# Patient Record
Sex: Female | Born: 1960 | Race: White | Hispanic: No | Marital: Married | Smoking: Former smoker
Health system: Southern US, Community
[De-identification: ages and names within clinical notes are randomized; demographics above are authoritative.]

## PROBLEM LIST (undated history)

## (undated) DIAGNOSIS — F32A Depression, unspecified: Secondary | ICD-10-CM

## (undated) DIAGNOSIS — F419 Anxiety disorder, unspecified: Secondary | ICD-10-CM

## (undated) DIAGNOSIS — G5600 Carpal tunnel syndrome, unspecified upper limb: Secondary | ICD-10-CM

## (undated) DIAGNOSIS — M545 Low back pain, unspecified: Secondary | ICD-10-CM

## (undated) DIAGNOSIS — M1990 Unspecified osteoarthritis, unspecified site: Secondary | ICD-10-CM

## (undated) DIAGNOSIS — R51 Headache: Secondary | ICD-10-CM

## (undated) DIAGNOSIS — N83202 Unspecified ovarian cyst, left side: Secondary | ICD-10-CM

## (undated) DIAGNOSIS — R519 Headache, unspecified: Secondary | ICD-10-CM

## (undated) DIAGNOSIS — M79603 Pain in arm, unspecified: Secondary | ICD-10-CM

## (undated) DIAGNOSIS — E559 Vitamin D deficiency, unspecified: Secondary | ICD-10-CM

## (undated) DIAGNOSIS — L719 Rosacea, unspecified: Secondary | ICD-10-CM

## (undated) HISTORY — DX: Headache: R51

## (undated) HISTORY — DX: Rosacea, unspecified: L71.9

## (undated) HISTORY — DX: Unspecified ovarian cyst, left side: N83.202

## (undated) HISTORY — DX: Low back pain: M54.5

## (undated) HISTORY — DX: Low back pain, unspecified: M54.50

## (undated) HISTORY — DX: Carpal tunnel syndrome, unspecified upper limb: G56.00

## (undated) HISTORY — DX: Vitamin D deficiency, unspecified: E55.9

## (undated) HISTORY — DX: Headache, unspecified: R51.9

## (undated) HISTORY — DX: Pain in arm, unspecified: M79.603

---

## 1998-12-01 ENCOUNTER — Other Ambulatory Visit: Admission: RE | Admit: 1998-12-01 | Discharge: 1998-12-01 | Payer: Self-pay | Admitting: *Deleted

## 2002-07-26 ENCOUNTER — Other Ambulatory Visit: Admission: RE | Admit: 2002-07-26 | Discharge: 2002-07-26 | Payer: Self-pay | Admitting: Family Medicine

## 2002-08-17 ENCOUNTER — Encounter: Payer: Self-pay | Admitting: Family Medicine

## 2002-08-17 ENCOUNTER — Ambulatory Visit (HOSPITAL_COMMUNITY): Admission: RE | Admit: 2002-08-17 | Discharge: 2002-08-17 | Payer: Self-pay | Admitting: Family Medicine

## 2003-02-06 ENCOUNTER — Other Ambulatory Visit: Admission: RE | Admit: 2003-02-06 | Discharge: 2003-02-06 | Payer: Self-pay | Admitting: Family Medicine

## 2004-11-13 ENCOUNTER — Ambulatory Visit (HOSPITAL_COMMUNITY): Admission: RE | Admit: 2004-11-13 | Discharge: 2004-11-13 | Payer: Self-pay | Admitting: Family Medicine

## 2005-03-16 ENCOUNTER — Encounter: Admission: RE | Admit: 2005-03-16 | Discharge: 2005-03-16 | Payer: Self-pay | Admitting: Internal Medicine

## 2006-01-12 ENCOUNTER — Encounter: Admission: RE | Admit: 2006-01-12 | Discharge: 2006-01-12 | Payer: Self-pay | Admitting: Family Medicine

## 2006-03-03 ENCOUNTER — Encounter: Admission: RE | Admit: 2006-03-03 | Discharge: 2006-03-03 | Payer: Self-pay | Admitting: Emergency Medicine

## 2006-04-21 ENCOUNTER — Other Ambulatory Visit: Admission: RE | Admit: 2006-04-21 | Discharge: 2006-04-21 | Payer: Self-pay | Admitting: Family Medicine

## 2007-05-09 ENCOUNTER — Encounter: Admission: RE | Admit: 2007-05-09 | Discharge: 2007-05-09 | Payer: Self-pay | Admitting: Family Medicine

## 2007-05-17 ENCOUNTER — Other Ambulatory Visit: Admission: RE | Admit: 2007-05-17 | Discharge: 2007-05-17 | Payer: Self-pay | Admitting: Family Medicine

## 2008-06-13 ENCOUNTER — Ambulatory Visit: Payer: Self-pay | Admitting: Internal Medicine

## 2008-06-13 DIAGNOSIS — J301 Allergic rhinitis due to pollen: Secondary | ICD-10-CM

## 2008-06-13 DIAGNOSIS — R05 Cough: Secondary | ICD-10-CM

## 2009-06-24 ENCOUNTER — Encounter: Admission: RE | Admit: 2009-06-24 | Discharge: 2009-06-24 | Payer: Self-pay | Admitting: Family Medicine

## 2009-07-09 ENCOUNTER — Other Ambulatory Visit: Admission: RE | Admit: 2009-07-09 | Discharge: 2009-07-09 | Payer: Self-pay | Admitting: Family Medicine

## 2010-10-21 ENCOUNTER — Other Ambulatory Visit: Admission: RE | Admit: 2010-10-21 | Discharge: 2010-10-21 | Payer: Self-pay | Admitting: Family Medicine

## 2010-11-02 ENCOUNTER — Encounter: Admission: RE | Admit: 2010-11-02 | Discharge: 2010-11-02 | Payer: Self-pay | Admitting: Family Medicine

## 2011-03-03 ENCOUNTER — Ambulatory Visit
Admission: RE | Admit: 2011-03-03 | Discharge: 2011-03-03 | Disposition: A | Discharge status: Home / Self Care | Payer: BC Managed Care – PPO | Source: Ambulatory Visit | Attending: Family Medicine | Admitting: Family Medicine

## 2011-03-03 ENCOUNTER — Other Ambulatory Visit: Payer: Self-pay | Admitting: Family Medicine

## 2011-03-03 DIAGNOSIS — R0789 Other chest pain: Secondary | ICD-10-CM

## 2011-03-03 DIAGNOSIS — M545 Low back pain: Secondary | ICD-10-CM

## 2011-09-03 ENCOUNTER — Ambulatory Visit
Admission: RE | Admit: 2011-09-03 | Discharge: 2011-09-03 | Disposition: A | Discharge status: Home / Self Care | Payer: BC Managed Care – PPO | Source: Ambulatory Visit | Attending: Family Medicine | Admitting: Family Medicine

## 2011-09-03 ENCOUNTER — Other Ambulatory Visit: Payer: Self-pay | Admitting: Family Medicine

## 2011-09-03 DIAGNOSIS — M79603 Pain in arm, unspecified: Secondary | ICD-10-CM

## 2011-09-03 DIAGNOSIS — M542 Cervicalgia: Secondary | ICD-10-CM

## 2012-11-17 ENCOUNTER — Other Ambulatory Visit: Payer: Self-pay | Admitting: Family Medicine

## 2012-11-17 DIAGNOSIS — Z1231 Encounter for screening mammogram for malignant neoplasm of breast: Secondary | ICD-10-CM

## 2012-11-20 ENCOUNTER — Ambulatory Visit
Admission: RE | Admit: 2012-11-20 | Discharge: 2012-11-20 | Disposition: A | Discharge status: Home / Self Care | Payer: BC Managed Care – PPO | Source: Ambulatory Visit | Attending: Family Medicine | Admitting: Family Medicine

## 2012-11-20 DIAGNOSIS — Z1231 Encounter for screening mammogram for malignant neoplasm of breast: Secondary | ICD-10-CM

## 2013-09-01 ENCOUNTER — Encounter: Payer: Self-pay | Admitting: Neurology

## 2013-09-01 DIAGNOSIS — R519 Headache, unspecified: Secondary | ICD-10-CM | POA: Insufficient documentation

## 2013-09-01 DIAGNOSIS — M545 Low back pain: Secondary | ICD-10-CM | POA: Insufficient documentation

## 2013-09-01 DIAGNOSIS — L719 Rosacea, unspecified: Secondary | ICD-10-CM | POA: Insufficient documentation

## 2013-09-01 DIAGNOSIS — N83202 Unspecified ovarian cyst, left side: Secondary | ICD-10-CM | POA: Insufficient documentation

## 2013-09-01 DIAGNOSIS — M79603 Pain in arm, unspecified: Secondary | ICD-10-CM | POA: Insufficient documentation

## 2013-09-01 DIAGNOSIS — G5600 Carpal tunnel syndrome, unspecified upper limb: Secondary | ICD-10-CM | POA: Insufficient documentation

## 2013-09-04 ENCOUNTER — Ambulatory Visit: Payer: BC Managed Care – PPO | Admitting: Neurology

## 2015-04-01 ENCOUNTER — Other Ambulatory Visit: Payer: Self-pay

## 2015-04-01 DIAGNOSIS — Z1231 Encounter for screening mammogram for malignant neoplasm of breast: Secondary | ICD-10-CM

## 2015-04-09 ENCOUNTER — Ambulatory Visit: Payer: Self-pay

## 2015-04-16 ENCOUNTER — Ambulatory Visit: Payer: Self-pay

## 2015-04-25 ENCOUNTER — Ambulatory Visit
Admission: RE | Admit: 2015-04-25 | Discharge: 2015-04-25 | Disposition: A | Discharge status: Home / Self Care | Payer: Managed Care, Other (non HMO) | Source: Ambulatory Visit

## 2015-04-25 DIAGNOSIS — Z1231 Encounter for screening mammogram for malignant neoplasm of breast: Secondary | ICD-10-CM

## 2015-04-30 ENCOUNTER — Other Ambulatory Visit: Payer: Self-pay | Admitting: Family Medicine

## 2015-04-30 DIAGNOSIS — R52 Pain, unspecified: Secondary | ICD-10-CM

## 2015-04-30 DIAGNOSIS — N644 Mastodynia: Secondary | ICD-10-CM

## 2015-05-07 ENCOUNTER — Ambulatory Visit
Admission: RE | Admit: 2015-05-07 | Discharge: 2015-05-07 | Disposition: A | Discharge status: Home / Self Care | Payer: Managed Care, Other (non HMO) | Source: Ambulatory Visit | Attending: Family Medicine | Admitting: Family Medicine

## 2015-05-07 ENCOUNTER — Other Ambulatory Visit: Payer: Self-pay | Admitting: Family Medicine

## 2015-05-07 DIAGNOSIS — R52 Pain, unspecified: Secondary | ICD-10-CM

## 2015-05-07 DIAGNOSIS — N644 Mastodynia: Secondary | ICD-10-CM

## 2015-12-19 ENCOUNTER — Other Ambulatory Visit: Payer: Self-pay | Admitting: Family Medicine

## 2015-12-19 DIAGNOSIS — N644 Mastodynia: Secondary | ICD-10-CM

## 2016-01-08 ENCOUNTER — Other Ambulatory Visit: Payer: Self-pay | Admitting: Family Medicine

## 2016-01-08 ENCOUNTER — Ambulatory Visit
Admission: RE | Admit: 2016-01-08 | Discharge: 2016-01-08 | Disposition: A | Discharge status: Home / Self Care | Payer: BLUE CROSS/BLUE SHIELD | Source: Ambulatory Visit | Attending: Family Medicine | Admitting: Family Medicine

## 2016-01-08 DIAGNOSIS — N644 Mastodynia: Secondary | ICD-10-CM

## 2016-01-13 ENCOUNTER — Other Ambulatory Visit: Payer: Self-pay | Admitting: Family Medicine

## 2016-01-13 ENCOUNTER — Other Ambulatory Visit (HOSPITAL_COMMUNITY)
Admission: RE | Admit: 2016-01-13 | Discharge: 2016-01-13 | Disposition: A | Discharge status: Home / Self Care | Payer: BLUE CROSS/BLUE SHIELD | Source: Ambulatory Visit | Attending: Family Medicine | Admitting: Family Medicine

## 2016-01-13 DIAGNOSIS — Z01419 Encounter for gynecological examination (general) (routine) without abnormal findings: Secondary | ICD-10-CM | POA: Diagnosis present

## 2016-01-15 LAB — CYTOLOGY - PAP

## 2017-05-11 ENCOUNTER — Other Ambulatory Visit: Payer: Self-pay | Admitting: Family Medicine

## 2017-05-11 DIAGNOSIS — R921 Mammographic calcification found on diagnostic imaging of breast: Secondary | ICD-10-CM

## 2017-05-20 ENCOUNTER — Ambulatory Visit
Admission: RE | Admit: 2017-05-20 | Discharge: 2017-05-20 | Disposition: A | Discharge status: Home / Self Care | Payer: Managed Care, Other (non HMO) | Source: Ambulatory Visit | Attending: Family Medicine | Admitting: Family Medicine

## 2017-05-20 DIAGNOSIS — R921 Mammographic calcification found on diagnostic imaging of breast: Secondary | ICD-10-CM

## 2019-10-08 ENCOUNTER — Other Ambulatory Visit: Payer: Self-pay | Admitting: Family Medicine

## 2019-10-08 DIAGNOSIS — Z1231 Encounter for screening mammogram for malignant neoplasm of breast: Secondary | ICD-10-CM

## 2019-10-17 ENCOUNTER — Other Ambulatory Visit: Payer: Self-pay | Admitting: Family Medicine

## 2019-10-17 ENCOUNTER — Other Ambulatory Visit (HOSPITAL_COMMUNITY)
Admission: RE | Admit: 2019-10-17 | Discharge: 2019-10-17 | Disposition: A | Discharge status: Home / Self Care | Payer: Managed Care, Other (non HMO) | Source: Ambulatory Visit | Attending: Family Medicine | Admitting: Family Medicine

## 2019-10-17 DIAGNOSIS — Z124 Encounter for screening for malignant neoplasm of cervix: Secondary | ICD-10-CM | POA: Insufficient documentation

## 2019-10-19 LAB — CYTOLOGY - PAP
Comment: NEGATIVE
Diagnosis: NEGATIVE
High risk HPV: NEGATIVE

## 2019-11-28 ENCOUNTER — Other Ambulatory Visit: Payer: Self-pay

## 2019-11-28 ENCOUNTER — Ambulatory Visit
Admission: RE | Admit: 2019-11-28 | Discharge: 2019-11-28 | Disposition: A | Discharge status: Home / Self Care | Payer: Managed Care, Other (non HMO) | Source: Ambulatory Visit | Attending: Family Medicine | Admitting: Family Medicine

## 2019-11-28 DIAGNOSIS — Z1231 Encounter for screening mammogram for malignant neoplasm of breast: Secondary | ICD-10-CM

## 2021-02-16 NOTE — Progress Notes (Addendum)
COVID Vaccine Completed: x3 Date COVID Vaccine completed: Has received booster: 12-21 COVID vaccine manufacturer: Pfizer      Date of COVID positive in last 75 days:NA  PCP - Beverley Fiedler, MD Cardiologist - N/A  Chest x-ray - N/A EKG - N/A Stress Test -  ECHO -  Cardiac Cath -  Pacemaker/ICD device last checked: Spinal Cord Stimulator:  Sleep Study - N/A CPAP -   Fasting Blood Sugar - N/A Checks Blood Sugar _____ times a day  Blood Thinner Instructions: N/A Aspirin Instructions: Last Dose:  Activity level:  Can go up a flight of stairs and perform activities of daily living without stopping and without symptoms of chest pain or shortness of breath.   Able to exercise without symptoms  Anesthesia review: N/A  Patient denies shortness of breath, fever, cough and chest pain at PAT appointment   Patient verbalized understanding of instructions that were given to them at the PAT appointment. Patient was also instructed that they will need to review over the PAT instructions again at home before surgery.

## 2021-02-16 NOTE — Patient Instructions (Addendum)
DUE TO COVID-19 ONLY ONE VISITOR IS ALLOWED TO COME WITH YOU AND STAY IN THE WAITING ROOM ONLY DURING PRE OP AND PROCEDURE.     COVID SWAB TESTING MUST BE COMPLETED ON:  Tuesday, 02-17-21 @ 10:45   [address] [place], [place] [address]  (Must self quarantine after testing. Follow instructions on handout.)        Your procedure is scheduled on:  Thursday, 02-19-21   Report to [facility] Main  Entrance   Report to admitting at 7:00 AM   Call this number if you have problems the morning of surgery [phone]   Do not eat food :After Midnight.   May have liquids until 6:00 AM day of surgery  CLEAR LIQUID DIET  Foods Allowed                                                                     Foods Excluded  Water, Black Coffee and tea, regular and decaf               liquids that you cannot  Plain Jell-O in any flavor  (No red)                                      see through such as: Fruit ices (not with fruit pulp)                                      milk, soups, orange juice              Iced Popsicles (No red)                                      All solid food                                   Apple juices Sports drinks like Gatorade (No red) Lightly seasoned clear broth or consume(fat free) Sugar, honey syrup     Oral Hygiene is also important to reduce your risk of infection.                                    Remember - BRUSH YOUR TEETH THE MORNING OF SURGERY WITH YOUR REGULAR TOOTHPASTE   Do NOT smoke after Midnight   Take these medicines the morning of surgery with A SIP OF WATER:  Tramadol if needed, Albuterol inhaler                               You may not have any metal on your body including hair pins, jewelry, and body piercings             Do not wear make-up, lotions, powders, perfumes/cologne, or deodorant  Do not wear nail polish.  Do not shave  48 hours prior to surgery.              Men may shave face and neck.   Do not bring  valuables to the hospital. [facility] IS NOT             RESPONSIBLE   FOR VALUABLES.   Contacts, dentures or bridgework may not be worn into surgery.   Patients discharged the day of surgery will not be allowed to drive home.               Please read over the following fact sheets you were given: IF YOU HAVE QUESTIONS ABOUT YOUR PRE OP INSTRUCTIONS PLEASE CALL  [phone] [facility] - Preparing for Surgery Before surgery, you can play an important role.  Because skin is not sterile, your skin needs to be as free of germs as possible.  You can reduce the number of germs on your skin by washing with CHG (chlorahexidine gluconate) soap before surgery.  CHG is an antiseptic cleaner which kills germs and bonds with the skin to continue killing germs even after washing. Please DO NOT use if you have an allergy to CHG or antibacterial soaps.  If your skin becomes reddened/irritated stop using the CHG and inform your nurse when you arrive at Short Stay. Do not shave (including legs and underarms) for at least 48 hours prior to the first CHG shower.  You may shave your face/neck.  Please follow these instructions carefully:  1.  Shower with CHG Soap the night before surgery and the  morning of surgery.  2.  If you choose to wash your hair, wash your hair first as usual with your normal  shampoo.  3.  After you shampoo, rinse your hair and body thoroughly to remove the shampoo.                             4.  Use CHG as you would any other liquid soap.  You can apply chg directly to the skin and wash.  Gently with a scrungie or clean washcloth.  5.  Apply the CHG Soap to your body ONLY FROM THE NECK DOWN.   Do   not use on face/ open                           Wound or open sores. Avoid contact with eyes, ears mouth and   genitals (private parts).                       Wash face,  Genitals (private parts) with your normal soap.             6.  Wash thoroughly, paying special attention to  the area where your    surgery  will be performed.  7.  Thoroughly rinse your body with warm water from the neck down.  8.  DO NOT shower/wash with your normal soap after using and rinsing off the CHG Soap.                9.  Pat yourself dry with a clean towel.            10.  Wear clean pajamas.            11.  Place clean sheets  on your bed the night of your first shower and do not  sleep with pets. Day of Surgery : Do not apply any lotions/deodorants the morning of surgery.  Please wear clean clothes to the hospital/surgery center.  FAILURE TO FOLLOW THESE INSTRUCTIONS MAY RESULT IN THE CANCELLATION OF YOUR SURGERY  PATIENT SIGNATURE_________________________________  NURSE SIGNATURE__________________________________  ________________________________________________________________________   Rogelia MireIncentive Spirometer  An incentive spirometer is a tool that can help keep your lungs clear and active. This tool measures how well you are filling your lungs with each breath. Taking long deep breaths may help reverse or decrease the chance of developing breathing (pulmonary) problems (especially infection) following:  A long period of time when you are unable to move or be active. BEFORE THE PROCEDURE   If the spirometer includes an indicator to show your best effort, your nurse or respiratory therapist will set it to a desired goal.  If possible, sit up straight or lean slightly forward. Try not to slouch.  Hold the incentive spirometer in an upright position. INSTRUCTIONS FOR USE  1. Sit on the edge of your bed if possible, or sit up as far as you can in bed or on a chair. 2. Hold the incentive spirometer in an upright position. 3. Breathe out normally. 4. Place the mouthpiece in your mouth and seal your lips tightly around it. 5. Breathe in slowly and as deeply as possible, raising the piston or the ball toward the top of the column. 6. Hold your breath for 3-5 seconds or for as long  as possible. Allow the piston or ball to fall to the bottom of the column. 7. Remove the mouthpiece from your mouth and breathe out normally. 8. Rest for a few seconds and repeat Steps 1 through 7 at least 10 times every 1-2 hours when you are awake. Take your time and take a few normal breaths between deep breaths. 9. The spirometer may include an indicator to show your best effort. Use the indicator as a goal to work toward during each repetition. 10. After each set of 10 deep breaths, practice coughing to be sure your lungs are clear. If you have an incision (the cut made at the time of surgery), support your incision when coughing by placing a pillow or rolled up towels firmly against it. Once you are able to get out of bed, walk around indoors and cough well. You may stop using the incentive spirometer when instructed by your caregiver.  RISKS AND COMPLICATIONS  Take your time so you do not get dizzy or light-headed.  If you are in pain, you may need to take or ask for pain medication before doing incentive spirometry. It is harder to take a deep breath if you are having pain. AFTER USE  Rest and breathe slowly and easily.  It can be helpful to keep track of a log of your progress. Your caregiver can provide you with a simple table to help with this. If you are using the spirometer at home, follow these instructions: SEEK MEDICAL CARE IF:   You are having difficultly using the spirometer.  You have trouble using the spirometer as often as instructed.  Your pain medication is not giving enough relief while using the spirometer.  You develop fever of 100.5 F (38.1 C) or higher. SEEK IMMEDIATE MEDICAL CARE IF:   You cough up bloody sputum that had not been present before.  You develop fever of 102 F (38.9 C) or greater.  You develop  worsening pain at or near the incision site. MAKE SURE YOU:   Understand these instructions.  Will watch your condition.  Will get help right  away if you are not doing well or get worse. Document Released: 04/04/2007 Document Revised: 02/14/2012 Document Reviewed: 06/05/2007 [facility] Patient Information 2014 [place], [place].   ________________________________________________________________________

## 2021-02-17 ENCOUNTER — Encounter (HOSPITAL_COMMUNITY): Payer: Self-pay

## 2021-02-17 ENCOUNTER — Other Ambulatory Visit (HOSPITAL_COMMUNITY): Payer: Managed Care, Other (non HMO)

## 2021-02-17 ENCOUNTER — Encounter (HOSPITAL_COMMUNITY)
Admission: RE | Admit: 2021-02-17 | Discharge: 2021-02-17 | Disposition: A | Discharge status: Home / Self Care | Payer: Worker's Compensation | Source: Ambulatory Visit | Attending: Orthopedic Surgery | Admitting: Orthopedic Surgery

## 2021-02-17 ENCOUNTER — Other Ambulatory Visit (HOSPITAL_COMMUNITY)
Admission: RE | Admit: 2021-02-17 | Discharge: 2021-02-17 | Disposition: A | Discharge status: Home / Self Care | Payer: Worker's Compensation | Source: Ambulatory Visit | Attending: Orthopaedic Surgery | Admitting: Orthopaedic Surgery

## 2021-02-17 ENCOUNTER — Other Ambulatory Visit: Payer: Self-pay

## 2021-02-17 DIAGNOSIS — Z01812 Encounter for preprocedural laboratory examination: Secondary | ICD-10-CM | POA: Insufficient documentation

## 2021-02-17 DIAGNOSIS — Z20822 Contact with and (suspected) exposure to covid-19: Secondary | ICD-10-CM | POA: Diagnosis not present

## 2021-02-17 HISTORY — DX: Anxiety disorder, unspecified: F41.9

## 2021-02-17 HISTORY — DX: Unspecified osteoarthritis, unspecified site: M19.90

## 2021-02-17 HISTORY — DX: Depression, unspecified: F32.A

## 2021-02-17 LAB — CBC
HCT: 38.2 % (ref 36.0–46.0)
Hemoglobin: 12.3 g/dL (ref 12.0–15.0)
MCH: 32.2 pg (ref 26.0–34.0)
MCHC: 32.2 g/dL (ref 30.0–36.0)
MCV: 100 fL (ref 80.0–100.0)
Platelets: 255 10*3/uL (ref 150–400)
RBC: 3.82 MIL/uL — ABNORMAL LOW (ref 3.87–5.11)
RDW: 12.5 % (ref 11.5–15.5)
WBC: 5.8 10*3/uL (ref 4.0–10.5)
nRBC: 0 % (ref 0.0–0.2)

## 2021-02-17 LAB — SURGICAL PCR SCREEN
MRSA, PCR: NEGATIVE
Staphylococcus aureus: POSITIVE — AB

## 2021-02-17 LAB — SARS CORONAVIRUS 2 (TAT 6-24 HRS): SARS Coronavirus 2: NEGATIVE

## 2021-02-17 NOTE — Progress Notes (Signed)
PCR results sent to Dr. Roney Mans to review.

## 2021-02-19 ENCOUNTER — Encounter (HOSPITAL_COMMUNITY): Admission: RE | Payer: Self-pay | Source: Ambulatory Visit

## 2021-02-19 ENCOUNTER — Ambulatory Visit (HOSPITAL_COMMUNITY)
Admission: RE | Admit: 2021-02-19 | Payer: Worker's Compensation | Source: Ambulatory Visit | Admitting: Orthopaedic Surgery

## 2021-02-19 SURGERY — OPEN REDUCTION INTERNAL FIXATION (ORIF) DISTAL RADIUS FRACTURE
Anesthesia: Monitor Anesthesia Care | Laterality: Left

## 2021-08-04 IMAGING — MG DIGITAL SCREENING BILAT W/ CAD
4 series · 4 of 4 positions shown · non-contrast
Comparison: Previous exam(s).

CLINICAL DATA: Screening.

EXAM:
DIGITAL SCREENING BILATERAL MAMMOGRAM WITH CAD

[R CC]
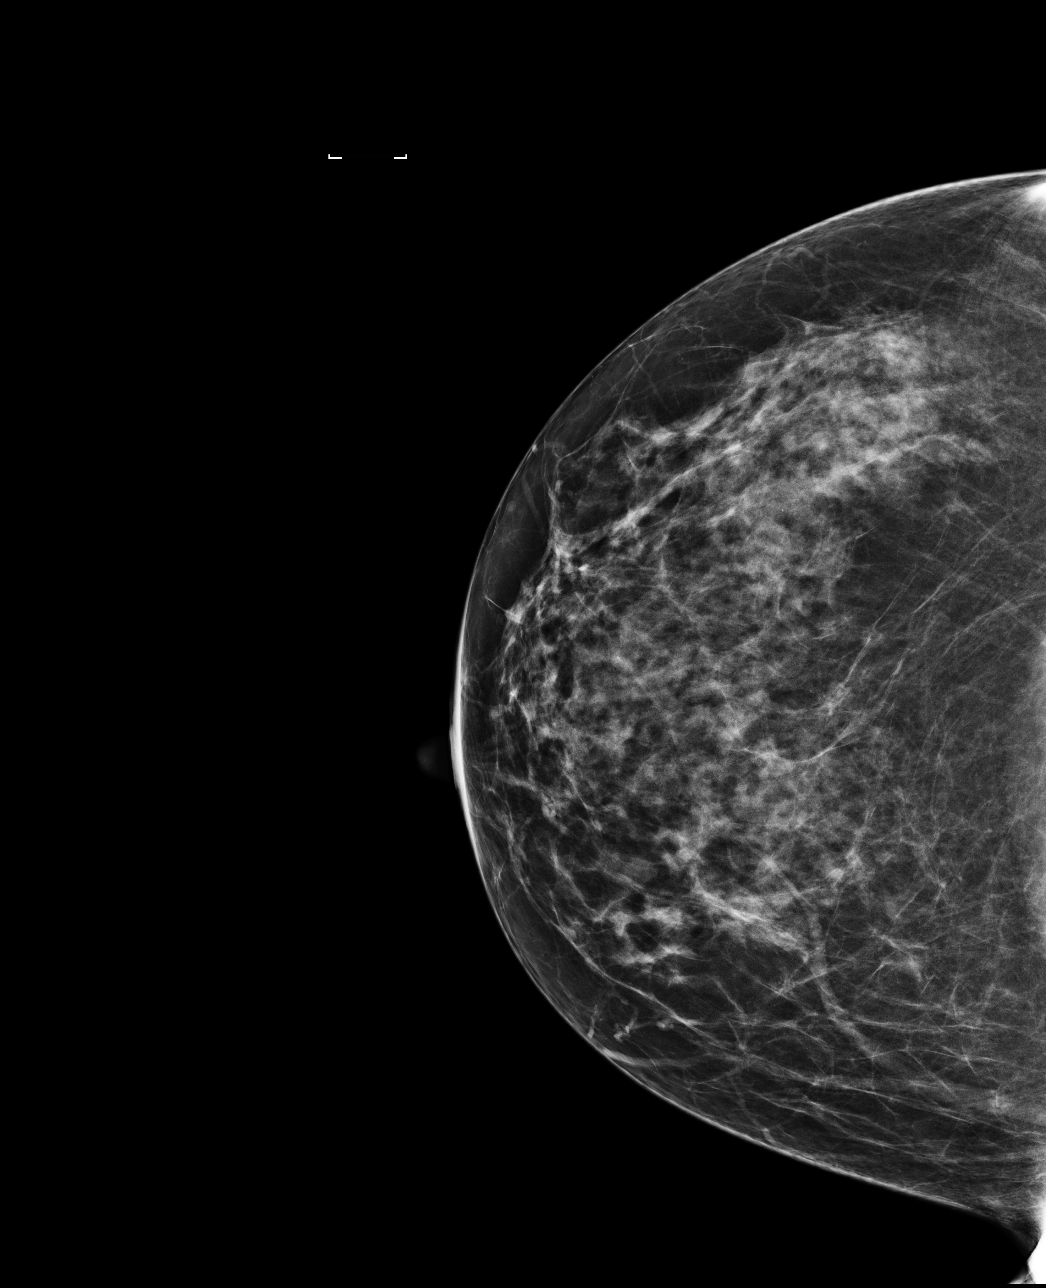

[R MLO]
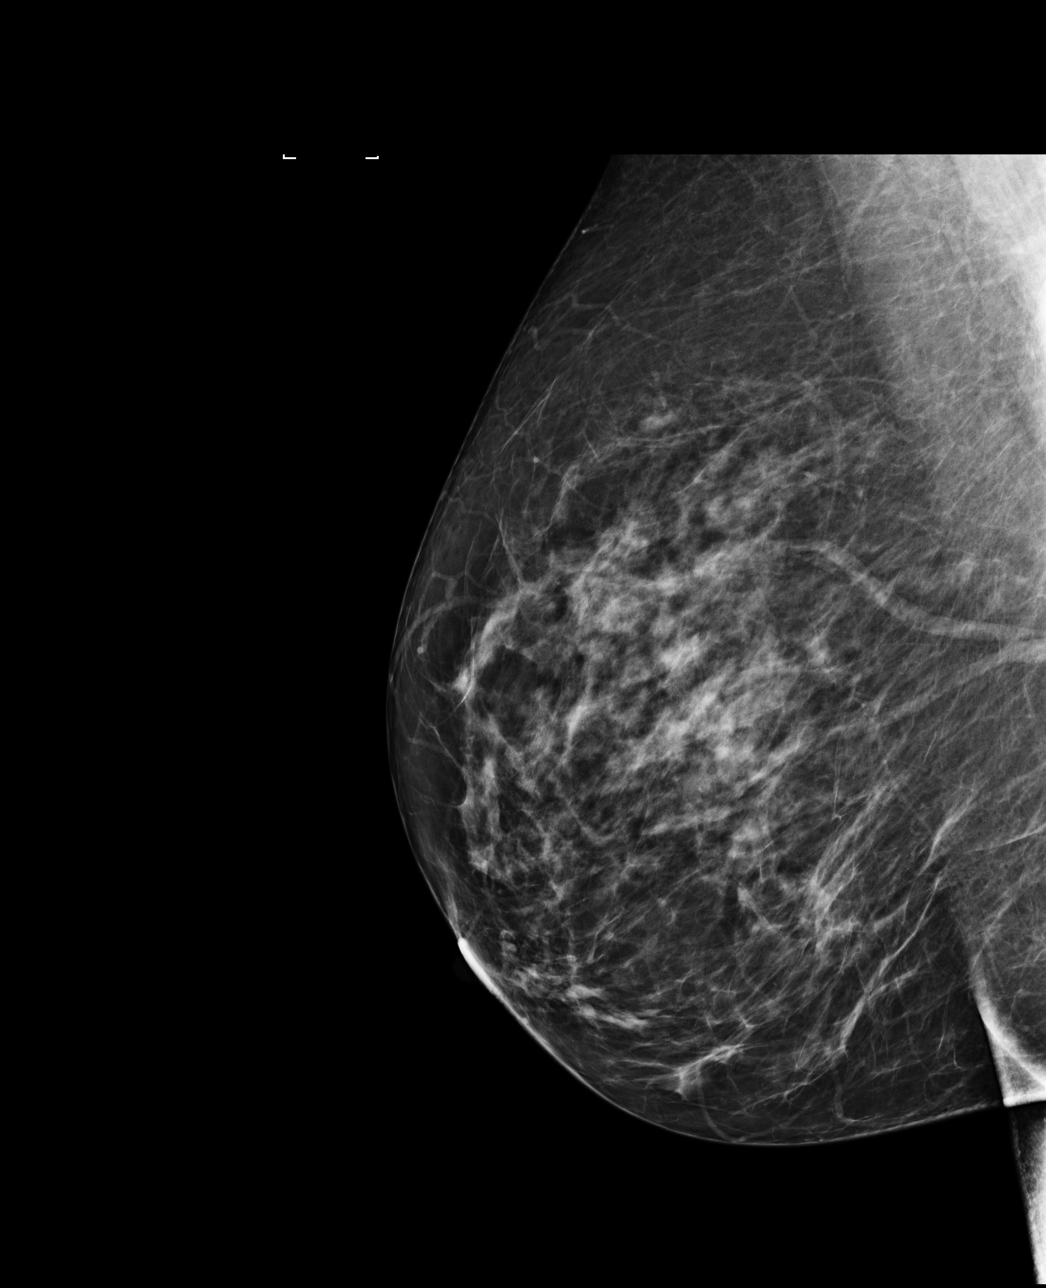

[L MLO]
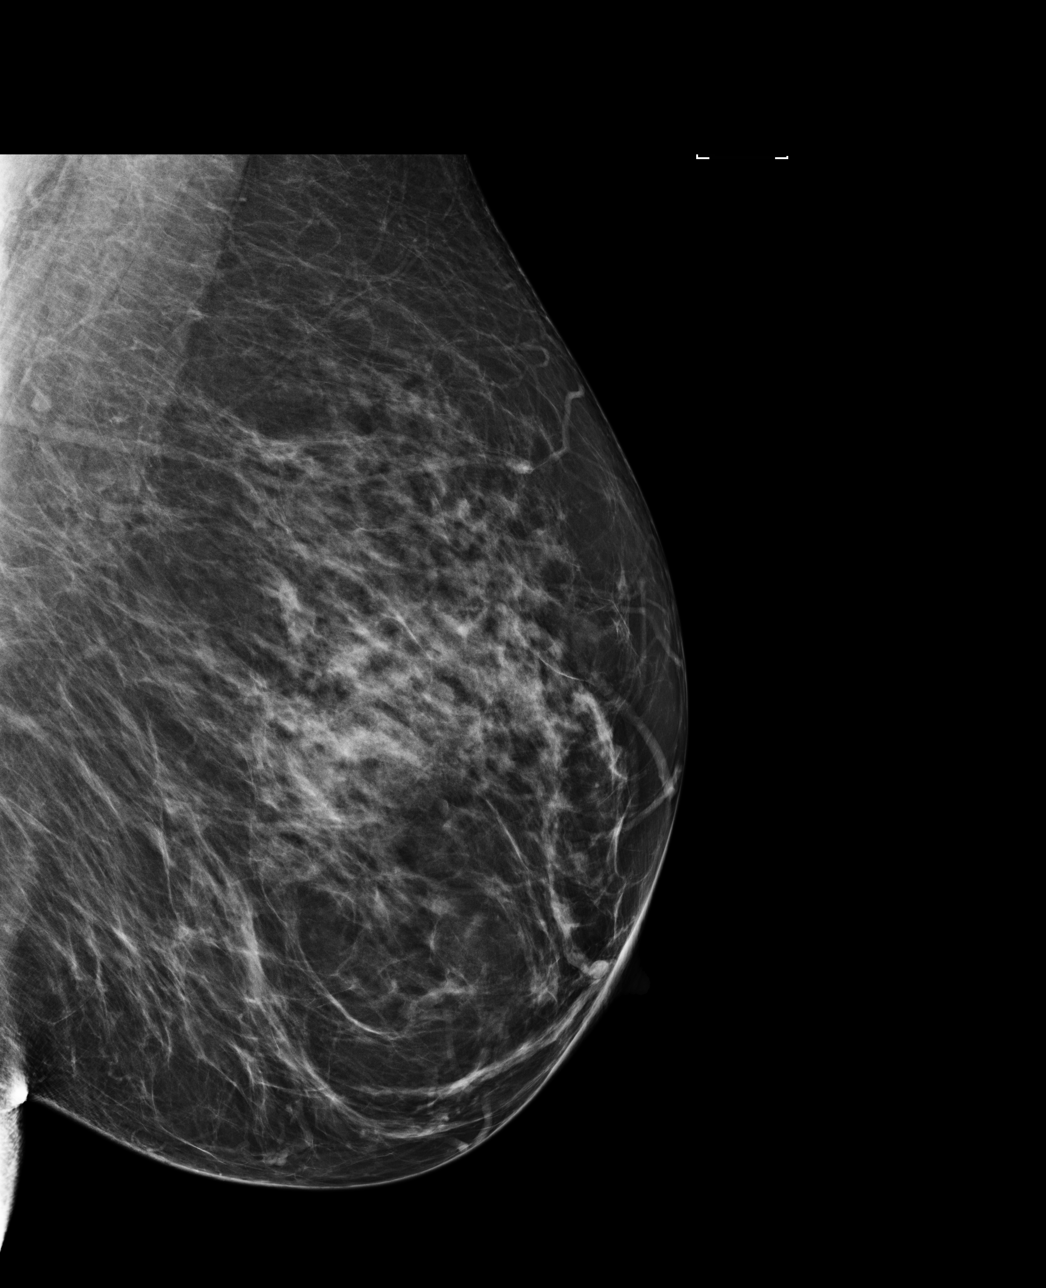

[L CC]
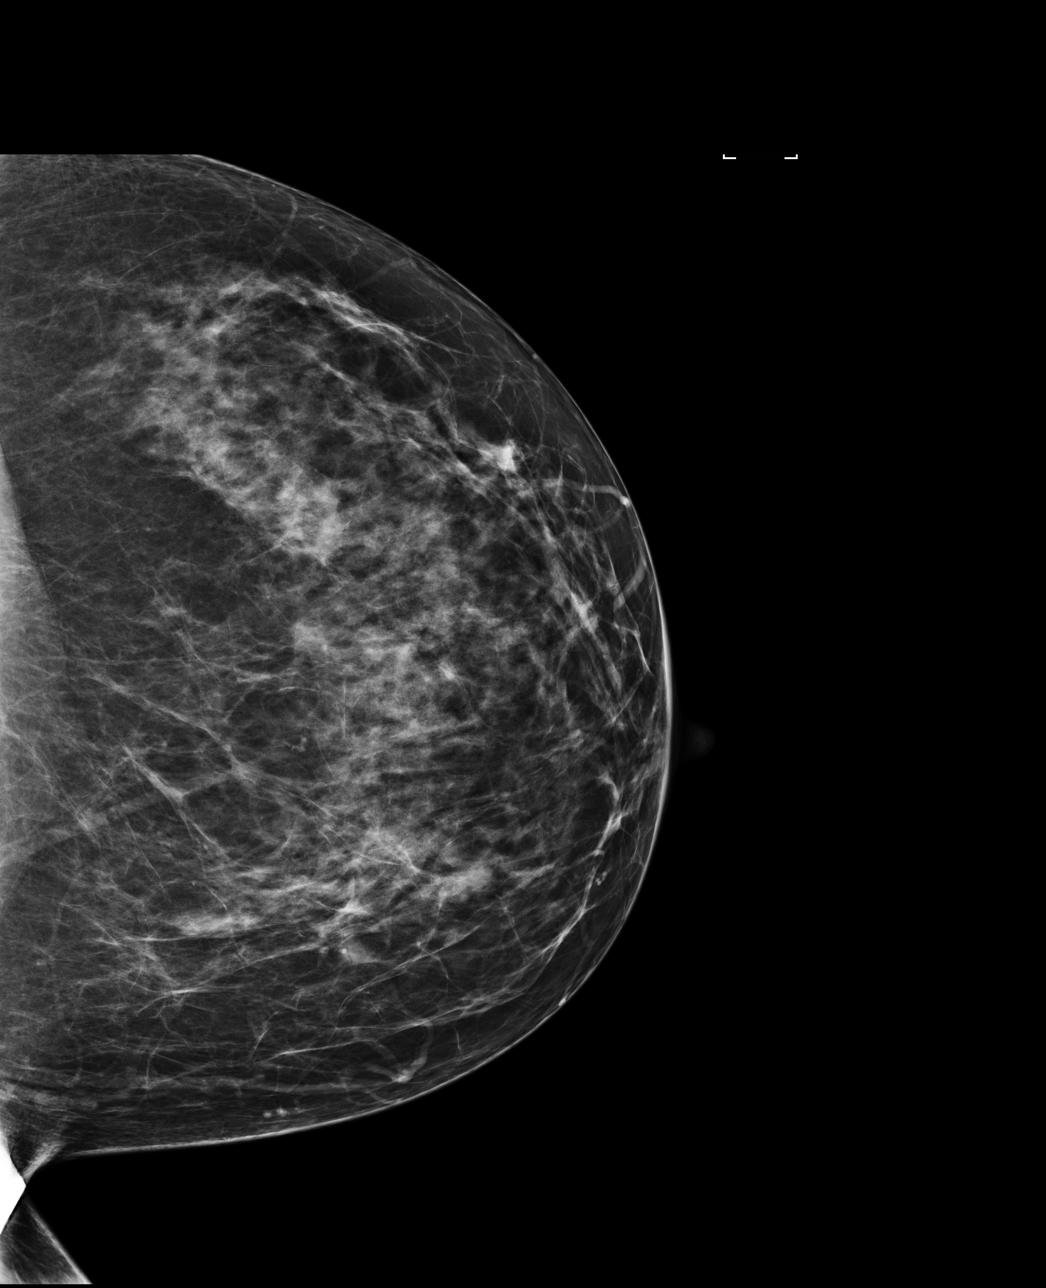

[4 of 4 positions shown; findings below may reference images not displayed]

ACR Breast Density Category c: The breast tissue is heterogeneously
dense, which may obscure small masses.
FINDINGS: There are no findings suspicious for malignancy. Images were
processed with CAD.
IMPRESSION: No mammographic evidence of malignancy. A result letter of this
screening mammogram will be mailed directly to the patient.

RECOMMENDATION:
Screening mammogram in one year. (Code:[id number])

BI-RADS CATEGORY  1: Negative.

## 2022-11-15 ENCOUNTER — Other Ambulatory Visit: Payer: Self-pay | Admitting: Family Medicine

## 2022-11-15 DIAGNOSIS — E2839 Other primary ovarian failure: Secondary | ICD-10-CM

## 2022-11-15 DIAGNOSIS — Z1231 Encounter for screening mammogram for malignant neoplasm of breast: Secondary | ICD-10-CM

## 2023-04-29 ENCOUNTER — Other Ambulatory Visit: Payer: Self-pay | Admitting: Family Medicine

## 2023-04-29 DIAGNOSIS — Z1231 Encounter for screening mammogram for malignant neoplasm of breast: Secondary | ICD-10-CM

## 2023-05-06 ENCOUNTER — Ambulatory Visit: Payer: Managed Care, Other (non HMO)

## 2023-05-06 ENCOUNTER — Ambulatory Visit
Admission: RE | Admit: 2023-05-06 | Discharge: 2023-05-06 | Disposition: A | Discharge status: Home / Self Care | Payer: Managed Care, Other (non HMO) | Source: Ambulatory Visit | Attending: Family Medicine | Admitting: Family Medicine

## 2023-05-06 DIAGNOSIS — E2839 Other primary ovarian failure: Secondary | ICD-10-CM

## 2024-02-27 ENCOUNTER — Ambulatory Visit
Admission: RE | Admit: 2024-02-27 | Discharge: 2024-02-27 | Disposition: A | Discharge status: Home / Self Care | Payer: Managed Care, Other (non HMO) | Source: Ambulatory Visit | Attending: Family Medicine | Admitting: Family Medicine

## 2024-02-27 DIAGNOSIS — Z1231 Encounter for screening mammogram for malignant neoplasm of breast: Secondary | ICD-10-CM
